# Patient Record
Sex: Male | Born: 1965 | Race: White | Hispanic: No | Smoking: Former smoker
Health system: Southern US, Community
[De-identification: ages and names within clinical notes are randomized; demographics above are authoritative.]

## PROBLEM LIST (undated history)

## (undated) DIAGNOSIS — I10 Essential (primary) hypertension: Secondary | ICD-10-CM

## (undated) HISTORY — PX: ROTATOR CUFF REPAIR: SHX139

## (undated) HISTORY — PX: HERNIA REPAIR: SHX51

## (undated) HISTORY — PX: NOSE SURGERY: SHX723

---

## 2015-06-22 ENCOUNTER — Emergency Department (HOSPITAL_COMMUNITY)

## 2015-06-22 ENCOUNTER — Encounter (HOSPITAL_COMMUNITY): Payer: Self-pay | Admitting: Emergency Medicine

## 2015-06-22 ENCOUNTER — Inpatient Hospital Stay (HOSPITAL_COMMUNITY)
Admission: EM | Admit: 2015-06-22 | Discharge: 2015-06-25 | DRG: 194 | Attending: Internal Medicine | Admitting: Internal Medicine

## 2015-06-22 DIAGNOSIS — Z8249 Family history of ischemic heart disease and other diseases of the circulatory system: Secondary | ICD-10-CM

## 2015-06-22 DIAGNOSIS — J441 Chronic obstructive pulmonary disease with (acute) exacerbation: Secondary | ICD-10-CM | POA: Diagnosis present

## 2015-06-22 DIAGNOSIS — Q249 Congenital malformation of heart, unspecified: Secondary | ICD-10-CM | POA: Diagnosis not present

## 2015-06-22 DIAGNOSIS — Z87891 Personal history of nicotine dependence: Secondary | ICD-10-CM | POA: Diagnosis not present

## 2015-06-22 DIAGNOSIS — J09X2 Influenza due to identified novel influenza A virus with other respiratory manifestations: Principal | ICD-10-CM | POA: Diagnosis present

## 2015-06-22 DIAGNOSIS — J45901 Unspecified asthma with (acute) exacerbation: Secondary | ICD-10-CM | POA: Diagnosis present

## 2015-06-22 DIAGNOSIS — R0600 Dyspnea, unspecified: Secondary | ICD-10-CM | POA: Diagnosis not present

## 2015-06-22 DIAGNOSIS — J40 Bronchitis, not specified as acute or chronic: Secondary | ICD-10-CM

## 2015-06-22 DIAGNOSIS — Z720 Tobacco use: Secondary | ICD-10-CM | POA: Diagnosis present

## 2015-06-22 DIAGNOSIS — J111 Influenza due to unidentified influenza virus with other respiratory manifestations: Secondary | ICD-10-CM

## 2015-06-22 DIAGNOSIS — J101 Influenza due to other identified influenza virus with other respiratory manifestations: Secondary | ICD-10-CM | POA: Diagnosis not present

## 2015-06-22 DIAGNOSIS — R0602 Shortness of breath: Secondary | ICD-10-CM | POA: Diagnosis present

## 2015-06-22 DIAGNOSIS — I10 Essential (primary) hypertension: Secondary | ICD-10-CM | POA: Diagnosis present

## 2015-06-22 DIAGNOSIS — I318 Other specified diseases of pericardium: Secondary | ICD-10-CM | POA: Diagnosis present

## 2015-06-22 DIAGNOSIS — Q248 Other specified congenital malformations of heart: Secondary | ICD-10-CM

## 2015-06-22 HISTORY — DX: Essential (primary) hypertension: I10

## 2015-06-22 LAB — BASIC METABOLIC PANEL
Anion gap: 4 — ABNORMAL LOW (ref 5–15)
BUN: 10 mg/dL (ref 6–20)
CHLORIDE: 107 mmol/L (ref 101–111)
CO2: 28 mmol/L (ref 22–32)
CREATININE: 0.91 mg/dL (ref 0.61–1.24)
Calcium: 8.8 mg/dL — ABNORMAL LOW (ref 8.9–10.3)
GFR calc Af Amer: >60 mL/min (ref >60)
GFR calc non Af Amer: >60 mL/min (ref >60)
Glucose, Bld: 87 mg/dL (ref 65–99)
Potassium: 4.1 mmol/L (ref 3.5–5.1)
Sodium: 139 mmol/L (ref 135–145)

## 2015-06-22 LAB — CBC WITH DIFFERENTIAL/PLATELET
Basophils Absolute: 0 10*3/uL (ref 0.0–0.1)
Basophils Relative: 1 %
EOS ABS: 0.4 10*3/uL (ref 0.0–0.7)
Eosinophils Relative: 7 %
HEMATOCRIT: 42.3 % (ref 39.0–52.0)
HEMOGLOBIN: 14 g/dL (ref 13.0–17.0)
LYMPHS ABS: 2.2 10*3/uL (ref 0.7–4.0)
Lymphocytes Relative: 40 %
MCH: 31.3 pg (ref 26.0–34.0)
MCHC: 33.1 g/dL (ref 30.0–36.0)
MCV: 94.6 fL (ref 78.0–100.0)
MONOS PCT: 11 %
Monocytes Absolute: 0.6 10*3/uL (ref 0.1–1.0)
NEUTROS ABS: 2.3 10*3/uL (ref 1.7–7.7)
NEUTROS PCT: 41 %
Platelets: 214 10*3/uL (ref 150–400)
RBC: 4.47 MIL/uL (ref 4.22–5.81)
RDW: 13.6 % (ref 11.5–15.5)
WBC: 5.5 10*3/uL (ref 4.0–10.5)

## 2015-06-22 LAB — INFLUENZA PANEL BY PCR (TYPE A & B)
H1N1FLUPCR: NOT DETECTED
INFLAPCR: POSITIVE — AB
Influenza B By PCR: NEGATIVE

## 2015-06-22 MED ORDER — IPRATROPIUM BROMIDE 0.02 % IN SOLN
0.5000 mg | Freq: Once | RESPIRATORY_TRACT | Status: AC
  Administered 2015-06-22: 0.5 mg via RESPIRATORY_TRACT
  Filled 2015-06-22: qty 2.5

## 2015-06-22 MED ORDER — OSELTAMIVIR PHOSPHATE 75 MG PO CAPS
75.0000 mg | ORAL_CAPSULE | Freq: Once | ORAL | Status: AC
  Administered 2015-06-22: 75 mg via ORAL
  Filled 2015-06-22: qty 1

## 2015-06-22 MED ORDER — IBUPROFEN 800 MG PO TABS
800.0000 mg | ORAL_TABLET | Freq: Once | ORAL | Status: AC
  Administered 2015-06-22: 800 mg via ORAL
  Filled 2015-06-22: qty 1

## 2015-06-22 MED ORDER — ACETAMINOPHEN 500 MG PO TABS
1000.0000 mg | ORAL_TABLET | Freq: Once | ORAL | Status: AC
  Administered 2015-06-22: 1000 mg via ORAL
  Filled 2015-06-22: qty 2

## 2015-06-22 MED ORDER — METHYLPREDNISOLONE SODIUM SUCC 125 MG IJ SOLR
125.0000 mg | Freq: Once | INTRAMUSCULAR | Status: AC
  Administered 2015-06-22: 125 mg via INTRAVENOUS
  Filled 2015-06-22: qty 2

## 2015-06-22 MED ORDER — SODIUM CHLORIDE 0.9 % IV BOLUS (SEPSIS)
1000.0000 mL | Freq: Once | INTRAVENOUS | Status: AC
  Administered 2015-06-22: 1000 mL via INTRAVENOUS

## 2015-06-22 MED ORDER — IPRATROPIUM-ALBUTEROL 0.5-2.5 (3) MG/3ML IN SOLN
3.0000 mL | Freq: Once | RESPIRATORY_TRACT | Status: AC
  Administered 2015-06-22: 3 mL via RESPIRATORY_TRACT
  Filled 2015-06-22: qty 3

## 2015-06-22 MED ORDER — MAGNESIUM SULFATE 2 GM/50ML IV SOLN
2.0000 g | Freq: Once | INTRAVENOUS | Status: AC
  Administered 2015-06-22: 2 g via INTRAVENOUS
  Filled 2015-06-22: qty 50

## 2015-06-22 MED ORDER — IOHEXOL 300 MG/ML  SOLN
75.0000 mL | Freq: Once | INTRAMUSCULAR | Status: AC | PRN
  Administered 2015-06-22: 75 mL via INTRAVENOUS

## 2015-06-22 MED ORDER — ALBUTEROL (5 MG/ML) CONTINUOUS INHALATION SOLN
10.0000 mg/h | INHALATION_SOLUTION | Freq: Once | RESPIRATORY_TRACT | Status: AC
  Administered 2015-06-22: 10 mg/h via RESPIRATORY_TRACT
  Filled 2015-06-22: qty 20

## 2015-06-22 NOTE — ED Notes (Signed)
Patient given meal tray at this time. Patient has Public relations account executiveCorrectional Officer at bedside at this time.

## 2015-06-22 NOTE — ED Notes (Signed)
Pt reports persistent SOB, cough, and chills since Thursday. Audible wheezing heard throughout lungs. Pt denies hx of COPD/asthma.

## 2015-06-22 NOTE — H&P (Signed)
PCP:   None   Chief Complaint:  Shortness of breath  HPI: This is a 50 y/o incarcerated male who did not receive a flu shot. On Thursday he started becoming SOB, he developed marked myalgia. He had episodes of diaphoresis. He denies fevers. He started wheezing which became progressively worse. He developed a dry hacking cough. She states the last two nights his SOB have been worse and he felt smothered. He finally saw the nurse who sent him to the ER. In the ER he was never hypoxic but he was wheezing greatly. He was given two rounds of nebulizer treatment, the 2nd was a 1 hour continuous neb treatment and continued to wheeze. The hospitalist was asked to admit. History provided by the patient and ER physician.    Review of Systems:  The patient denies anorexia, fever, weight loss,, vision loss, decreased hearing, hoarseness, chest pain, syncope, dyspnea on exertion, wheeze, peripheral edema, balance deficits, hemoptysis, abdominal pain, melena, hematochezia, severe indigestion/heartburn, hematuria, incontinence, genital sores, muscle weakness, muscle ache, suspicious skin lesions, transient blindness, difficulty walking, depression, unusual weight change, abnormal bleeding, enlarged lymph nodes, angioedema, and breast masses.  Past Medical History: Past Medical History  Diagnosis Date  . Hypertension    Past Surgical History  Procedure Laterality Date  . Hernia repair    . Rotator cuff repair    . Nose surgery      Medications: Prior to Admission medications   Medication Sig Start Date End Date Taking? Authorizing Provider  lisinopril (PRINIVIL,ZESTRIL) 40 MG tablet Take 40 mg by mouth daily.   Yes Historical Provider, MD    Allergies:  No Known Allergies  Social History:  reports that he has quit smoking. He does not have any smokeless tobacco history on file. He reports that he does not drink alcohol or use illicit drugs.  Family History: CAD  Physical Exam: Filed Vitals:    06/22/15 1921 06/22/15 1930 06/22/15 1945 06/22/15 2000  BP:  111/68  119/69  Pulse:  77 85 98  Temp:      TempSrc:      Resp:  19 20 15   Height:      Weight:      SpO2: 95% 100% 100% 100%    General:  Alert and oriented times three, well developed and nourished, no acute distress, clammy, somewhat ill appearing Eyes: PERRLA, pink conjunctiva, no scleral icterus ENT: Moist oral mucosa, neck supple, no thyromegaly Lungs: clear to ascultation, generalized wheeze, no crackles, no use of accessory muscles Cardiovascular: regular rate and rhythm, no regurgitation, no gallops, no murmurs. No carotid bruits, no JVD Abdomen: soft, positive BS, non-tender, non-distended, no organomegaly, not an acute abdomen GU: not examined Neuro: CN II - XII grossly intact, sensation intact Musculoskeletal: strength 5/5 all extremities, no clubbing, cyanosis or edema Skin: no rash, no subcutaneous crepitation, no decubitus Psych: appropriate patient   Labs on Admission:   Recent Labs  06/22/15 1626  NA 139  K 4.1  CL 107  CO2 28  GLUCOSE 87  BUN 10  CREATININE 0.91  CALCIUM 8.8*   No results for input(s): AST, ALT, ALKPHOS, BILITOT, PROT, ALBUMIN in the last 72 hours. No results for input(s): LIPASE, AMYLASE in the last 72 hours.  Recent Labs  06/22/15 1626  WBC 5.5  NEUTROABS 2.3  HGB 14.0  HCT 42.3  MCV 94.6  PLT 214   No results for input(s): CKTOTAL, CKMB, CKMBINDEX, TROPONINI in the last 72 hours. Invalid input(s): POCBNP  No results for input(s): DDIMER in the last 72 hours. No results for input(s): HGBA1C in the last 72 hours. No results for input(s): CHOL, HDL, LDLCALC, TRIG, CHOLHDL, LDLDIRECT in the last 72 hours. No results for input(s): TSH, T4TOTAL, T3FREE, THYROIDAB in the last 72 hours.  Invalid input(s): FREET3 No results for input(s): VITAMINB12, FOLATE, FERRITIN, TIBC, IRON, RETICCTPCT in the last 72 hours.  Micro Results: No results found for this or any  previous visit (from the past 240 hour(s)).   Radiological Exams on Admission: Dg Chest 2 View  06/22/2015  CLINICAL DATA:  Persistent SOB, cough, and chills since Thursday. wheezing heard throughout lungs. EXAM: CHEST  2 VIEW COMPARISON:  None available FINDINGS: The heart size is normal. At the right cardiophrenic margin there is an oval mass, measuring approximately 8.7 x 5.4 x 5.8 cm. Lungs are otherwise free of focal consolidations and pleural effusions. No pulmonary edema. Mild mid thoracic spondylosis noted. IMPRESSION: 1. Right cardiophrenic angle mass warrants further evaluation. CT of the chest is recommended. Intravenous contrast is recommended unless it is contraindicated. 2. No focal consolidations or significant bronchitic changes. Electronically Signed   By: Norva Pavlov M.D.   On: 06/22/2015 15:27   Ct Chest W Contrast  06/22/2015  CLINICAL DATA:  Right cardiophrenic angle mass on chest radiographs. Shortness of breath, cough, and chills beginning 4 days ago. Wheezing. EXAM: CT CHEST WITH CONTRAST TECHNIQUE: Multidetector CT imaging of the chest was performed during intravenous contrast administration. CONTRAST:  75mL OMNIPAQUE IOHEXOL 300 MG/ML  SOLN COMPARISON:  Chest radiographs earlier today FINDINGS: Borderline enlarged mediastinal lymph nodes measure up to 1 cm in short axis, most notable in the AP window, right paratracheal, and subcarinal stations. No enlarged hilar lymph nodes are identified. The heart is normal in size. Mild coronary artery calcification is noted. There is a homogeneous low-density cystic mass in the right cardiophrenic angle corresponding to the abnormality on chest radiographs and measuring 6.2 x 4.8 cm. There is no pleural effusion. Major airways are patent. Subsegmental atelectasis is present in the lung bases. The visualized portion of the upper abdomen is unremarkable. Mild thoracic spondylosis is noted. IMPRESSION: 1. 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 2. Mild bibasilar atelectasis. Electronically Signed   By: Sebastian Ache M.D.   On: 06/22/2015 18:41    Assessment/Plan Present on Admission:  . Influenza A -admit to Medsurg -Tamiflu  PO BiD -duonebs as needed -oxygen to keep sats greater than 88% Acute COPD Exacerbation -solumedrol  IV every 8 hours HTN -stable, resume home medications  Right cardiophrenic angle mass -Dr Manus Gunning spoke with CT surgeon who staes nothing to do now but patient may need elective surgery in future   Serria Sloma 06/22/2015, 8:45 PM

## 2015-06-22 NOTE — ED Provider Notes (Signed)
CSN: [id number]     Arrival date & time 06/22/15  1453 History   First MD Initiated Contact with Patient 06/22/15 1613     Chief Complaint  Patient presents with  . Shortness of Breath     (Consider location/radiation/quality/duration/timing/severity/associated sxs/prior Treatment) HPI Comments: Patient from prison with 4 day history of URI symptoms, cough, bodyaches, chills, SOB, congestion.  No documented fevers or chest pain.  Former smoker.  Denies any diagnosis of COPD or asthma. No leg pain or leg swelling.  Has had poor appetite but no vomiting or diarrhea.  Sick contacts at prison.  Did not receive flu shot.  Has been incarcerated 9 months.  But was also in prison in the 1990s.  The history is provided by the patient.    Past Medical History  Diagnosis Date  . Hypertension    Past Surgical History  Procedure Laterality Date  . Hernia repair    . Rotator cuff repair    . Nose surgery     No family history on file. Social History  Substance Use Topics  . Smoking status: Former Games developer  . Smokeless tobacco: None  . Alcohol Use: No    Review of Systems  Constitutional: Positive for chills, activity change, appetite change and fatigue. Negative for fever.  HENT: Positive for congestion and rhinorrhea.   Eyes: Negative for visual disturbance.  Respiratory: Positive for cough, chest tightness and shortness of breath.   Cardiovascular: Negative for chest pain and leg swelling.  Gastrointestinal: Negative for nausea and vomiting.  Genitourinary: Negative for dysuria and hematuria.  Musculoskeletal: Positive for myalgias and arthralgias.  Skin: Negative for rash.  Neurological: Positive for weakness. Negative for dizziness and headaches.  A complete 10 system review of systems was obtained and all systems are negative except as noted in the HPI and PMH.      Allergies  Review of patient's allergies indicates no known allergies.  Home Medications   Prior to Admission  medications   Medication Sig Start Date End Date Taking? Authorizing Provider  lisinopril (PRINIVIL,ZESTRIL) 40 MG tablet Take 40 mg by mouth daily.   Yes Historical Provider, MD   BP 106/54 mmHg  Pulse 95  Temp(Src) 98.6 F (37 C) (Oral)  Resp 21  Ht 6' (1.829 m)  Wt 200 lb (90.719 kg)  BMI 27.12 kg/m2  SpO2 92% Physical Exam  Constitutional: He is oriented to person, place, and time. He appears well-developed and well-nourished. No distress.  Shaking chills  HENT:  Head: Normocephalic and atraumatic.  Mouth/Throat: Oropharynx is clear and moist. No oropharyngeal exudate.  Eyes: Conjunctivae and EOM are normal. Pupils are equal, round, and reactive to light.  Neck: Normal range of motion. Neck supple.  No meningismus.  Cardiovascular: Normal rate, regular rhythm, normal heart sounds and intact distal pulses.   No murmur heard. Pulmonary/Chest: Effort normal. No respiratory distress. He has wheezes.  Wheezing throughout  Abdominal: Soft. There is no tenderness. There is no rebound and no guarding.  Musculoskeletal: Normal range of motion. He exhibits no edema or tenderness.  Neurological: He is alert and oriented to person, place, and time. No cranial nerve deficit. He exhibits normal muscle tone. Coordination normal.  No ataxia on finger to nose bilaterally. No pronator drift. 5/5 strength throughout. CN 2-12 intact.Equal grip strength. Sensation intact.   Skin: Skin is warm.  Psychiatric: He has a normal mood and affect. His behavior is normal.  Nursing note and vitals reviewed.   ED Course  Procedures (including critical care time) Labs Review Labs Reviewed  BASIC METABOLIC PANEL - Abnormal; Notable for the following:    Calcium 8.8 (*)    Anion gap 4 (*)    All other components within normal limits  INFLUENZA PANEL BY PCR (TYPE A & B, H1N1) - Abnormal; Notable for the following:    Influenza A By PCR POSITIVE (*)    All other components within normal limits  CBC WITH  DIFFERENTIAL/PLATELET  CBC  CREATININE, SERUM  BASIC METABOLIC PANEL  CBC    Imaging Review Dg Chest 2 View  06/22/2015  CLINICAL DATA:  Persistent SOB, cough, and chills since Thursday. wheezing heard throughout lungs. EXAM: CHEST  2 VIEW COMPARISON:  None available FINDINGS: The heart size is normal. At the right cardiophrenic margin there is an oval mass, measuring approximately 8.7 x 5.4 x 5.8 cm. Lungs are otherwise free of focal consolidations and pleural effusions. No pulmonary edema. Mild mid thoracic spondylosis noted. IMPRESSION: 1. Right cardiophrenic angle mass warrants further evaluation. CT of the chest is recommended. Intravenous contrast is recommended unless it is contraindicated. 2. No focal consolidations or significant bronchitic changes. Electronically Signed   By: Norva PavlovElizabeth  Brown M.D.   On: 06/22/2015 15:27   Ct Chest W Contrast  06/22/2015  CLINICAL DATA:  Right cardiophrenic angle mass on chest radiographs. Shortness of breath, cough, and chills beginning 4 days ago. Wheezing. EXAM: CT CHEST WITH CONTRAST TECHNIQUE: Multidetector CT imaging of the chest was performed during intravenous contrast administration. CONTRAST:  75mL OMNIPAQUE IOHEXOL 300 MG/ML  SOLN COMPARISON:  Chest radiographs earlier today FINDINGS: Borderline enlarged mediastinal lymph nodes measure up to 1 cm in short axis, most notable in the AP window, right paratracheal, and subcarinal stations. No enlarged hilar lymph nodes are identified. The heart is normal in size. Mild coronary artery calcification is noted. There is a homogeneous low-density cystic mass in the right cardiophrenic angle corresponding to the abnormality on chest radiographs and measuring 6.2 x 4.8 cm. There is no pleural effusion. Major airways are patent. Subsegmental atelectasis is present in the lung bases. The visualized portion of the upper abdomen is unremarkable. Mild thoracic spondylosis is noted. IMPRESSION: 1. 6.2 cm right  cardiophrenic angle mass consistent with a pericardial cyst. 2. Mild bibasilar atelectasis. Electronically Signed   By: Sebastian AcheAllen  Grady M.D.   On: 06/22/2015 18:41   I have personally reviewed and evaluated these images and lab results as part of my medical decision-making.   EKG Interpretation   Date/Time:  Monday June 22 2015 16:19:30 EDT Ventricular Rate:  66 PR Interval:  176 QRS Duration: 94 QT Interval:  392 QTC Calculation: 411 R Axis:   72 Text Interpretation:  Sinus rhythm No previous ECGs available Confirmed by  Samah Lapiana  MD, Sherell Christoffel [phone]([id number]) on 06/22/2015 4:26:41 PM      MDM   Final diagnoses:  Bronchitis  Influenza   4 days of URI symptoms with cough, wheezing, bodyaches.  Former smoker.  Patient given nebulizers and steroids. There is an irregular mass on his chest x-ray.  Remains wheezing after 2 nebulizers and steroids. He is not hypoxic with ambulation.  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 Discussed with Dr. Loistine ChanceGehrhardt. He recommends outpatient followup back at prison. Patient still wheezing after multiple nebs.  He is able to ambulate, does not become hypoxic but is very dyspneic.   Flu swab positive.  Will start tamiflu. With persistent increased work of breathing and wheezing will  admit.  D/w Dr. Joneen Roach.   Glynn Octave, MD 06/23/15 [phone]

## 2015-06-23 ENCOUNTER — Inpatient Hospital Stay (HOSPITAL_COMMUNITY)

## 2015-06-23 DIAGNOSIS — R0600 Dyspnea, unspecified: Secondary | ICD-10-CM

## 2015-06-23 LAB — BASIC METABOLIC PANEL
Anion gap: 8 (ref 5–15)
BUN: 12 mg/dL (ref 6–20)
CO2: 20 mmol/L — AB (ref 22–32)
Calcium: 8.7 mg/dL — ABNORMAL LOW (ref 8.9–10.3)
Chloride: 110 mmol/L (ref 101–111)
Creatinine, Ser: 0.89 mg/dL (ref 0.61–1.24)
GFR calc non Af Amer: >60 mL/min (ref >60)
Glucose, Bld: 203 mg/dL — ABNORMAL HIGH (ref 65–99)
Potassium: 4.2 mmol/L (ref 3.5–5.1)
SODIUM: 138 mmol/L (ref 135–145)

## 2015-06-23 LAB — ECHOCARDIOGRAM COMPLETE
HEIGHTINCHES: 72 in
Weight: 3200 oz

## 2015-06-23 LAB — CBC
HEMATOCRIT: 36.2 % — AB (ref 39.0–52.0)
Hemoglobin: 11.9 g/dL — ABNORMAL LOW (ref 13.0–17.0)
MCH: 31.5 pg (ref 26.0–34.0)
MCHC: 32.9 g/dL (ref 30.0–36.0)
MCV: 95.8 fL (ref 78.0–100.0)
Platelets: 197 10*3/uL (ref 150–400)
RBC: 3.78 MIL/uL — ABNORMAL LOW (ref 4.22–5.81)
RDW: 13.5 % (ref 11.5–15.5)
WBC: 4 10*3/uL (ref 4.0–10.5)

## 2015-06-23 LAB — MRSA PCR SCREENING: MRSA by PCR: NEGATIVE

## 2015-06-23 MED ORDER — IPRATROPIUM-ALBUTEROL 0.5-2.5 (3) MG/3ML IN SOLN
3.0000 mL | RESPIRATORY_TRACT | Status: DC
  Administered 2015-06-23 – 2015-06-24 (×7): 3 mL via RESPIRATORY_TRACT
  Filled 2015-06-23 (×7): qty 3

## 2015-06-23 MED ORDER — OSELTAMIVIR PHOSPHATE 75 MG PO CAPS
75.0000 mg | ORAL_CAPSULE | Freq: Two times a day (BID) | ORAL | Status: DC
  Administered 2015-06-23 – 2015-06-25 (×6): 75 mg via ORAL
  Filled 2015-06-23 (×6): qty 1

## 2015-06-23 MED ORDER — IPRATROPIUM-ALBUTEROL 0.5-2.5 (3) MG/3ML IN SOLN
3.0000 mL | RESPIRATORY_TRACT | Status: DC | PRN
  Administered 2015-06-23 (×2): 3 mL via RESPIRATORY_TRACT
  Filled 2015-06-23 (×2): qty 3

## 2015-06-23 MED ORDER — ENOXAPARIN SODIUM 40 MG/0.4ML ~~LOC~~ SOLN
40.0000 mg | SUBCUTANEOUS | Status: DC
  Administered 2015-06-23 – 2015-06-24 (×3): 40 mg via SUBCUTANEOUS
  Filled 2015-06-23 (×3): qty 0.4

## 2015-06-23 MED ORDER — ACETAMINOPHEN 650 MG RE SUPP: 650.0000 mg | Freq: Four times a day (QID) | RECTAL | Status: DC | PRN

## 2015-06-23 MED ORDER — ACETAMINOPHEN 325 MG PO TABS
650.0000 mg | ORAL_TABLET | Freq: Four times a day (QID) | ORAL | Status: DC | PRN
  Administered 2015-06-23 – 2015-06-24 (×2): 650 mg via ORAL
  Filled 2015-06-23 (×2): qty 2

## 2015-06-23 MED ORDER — NICOTINE 14 MG/24HR TD PT24: 14.0000 mg | MEDICATED_PATCH | Freq: Every day | TRANSDERMAL | Status: DC

## 2015-06-23 MED ORDER — OXYCODONE-ACETAMINOPHEN 5-325 MG PO TABS
1.0000 | ORAL_TABLET | ORAL | Status: DC | PRN
  Administered 2015-06-23 (×2): 2 via ORAL
  Administered 2015-06-24 – 2015-06-25 (×4): 1 via ORAL
  Filled 2015-06-23 (×4): qty 1
  Filled 2015-06-23 (×2): qty 2

## 2015-06-23 MED ORDER — GUAIFENESIN-CODEINE 100-10 MG/5ML PO SOLN
10.0000 mL | ORAL | Status: DC | PRN
  Administered 2015-06-23 – 2015-06-24 (×4): 10 mL via ORAL
  Filled 2015-06-23 (×4): qty 10

## 2015-06-23 MED ORDER — SENNOSIDES-DOCUSATE SODIUM 8.6-50 MG PO TABS
1.0000 | ORAL_TABLET | Freq: Every evening | ORAL | Status: DC | PRN
  Filled 2015-06-23: qty 1

## 2015-06-23 MED ORDER — PROMETHAZINE HCL 12.5 MG PO TABS: 12.5000 mg | ORAL_TABLET | Freq: Four times a day (QID) | ORAL | Status: DC | PRN

## 2015-06-23 MED ORDER — LISINOPRIL 10 MG PO TABS
40.0000 mg | ORAL_TABLET | Freq: Every day | ORAL | Status: DC
  Administered 2015-06-23 – 2015-06-25 (×3): 40 mg via ORAL
  Filled 2015-06-23 (×3): qty 4

## 2015-06-23 MED ORDER — METHYLPREDNISOLONE SODIUM SUCC 125 MG IJ SOLR
60.0000 mg | Freq: Three times a day (TID) | INTRAMUSCULAR | Status: DC
  Administered 2015-06-23 – 2015-06-25 (×9): 60 mg via INTRAVENOUS
  Filled 2015-06-23 (×9): qty 2

## 2015-06-23 NOTE — Progress Notes (Signed)
[facility] PROGRESS NOTE  Ronald Willis XLK:[id number] DOB: 12/21/1965    PCP:   No primary care provider on file.   HPI: Ronald Willis is an 50 y.o. male with hx of HTN, ? Known pericardial cyst, asthma, admitted from prison for influenza, bronchospasm, and enlarging pericardial cyst.  He is doing a little better.  He is having myalgia, and wheezing.  Thoracic surgery was consulted over the phone, recommneded follow up after discharge.   Rewiew of Systems:  Constitutional: Negative for malaise, fever and chills. No significant weight loss or weight gain Eyes: Negative for eye pain, redness and discharge, diplopia, visual changes, or flashes of light. ENMT: Negative for ear pain, hoarseness, nasal congestion, sinus pressure and sore throat. No headaches; tinnitus, drooling, or problem swallowing. Cardiovascular: Negative for chest pain, palpitations, diaphoresis, dyspnea and peripheral edema. ; No orthopnea, PND Respiratory: Negative for cough, hemoptysis, wheezing and stridor. No pleuritic chestpain. Gastrointestinal: Negative for nausea, vomiting, diarrhea, constipation, abdominal pain, melena, blood in stool, hematemesis, jaundice and rectal bleeding.    Genitourinary: Negative for frequency, dysuria, incontinence,flank pain and hematuria; Musculoskeletal: Negative for back pain and neck pain. Negative for swelling and trauma.;  Skin: . Negative for pruritus, rash, abrasions, bruising and skin lesion.; ulcerations Neuro: Negative for headache, lightheadedness and neck stiffness. Negative for weakness, altered level of consciousness , altered mental status, extremity weakness, burning feet, involuntary movement, seizure and syncope.  Psych: negative for anxiety, depression, insomnia, tearfulness, panic attacks, hallucinations, paranoia, suicidal or homicidal ideation  Past Medical History  Diagnosis Date  . Hypertension     Past Surgical History  Procedure Laterality Date   . Hernia repair    . Rotator cuff repair    . Nose surgery      Medications:  HOME MEDS: Prior to Admission medications   Medication Sig Start Date End Date Taking? Authorizing Provider  lisinopril (PRINIVIL,ZESTRIL) 40 MG tablet Take 40 mg by mouth daily.   Yes Historical Provider, MD     Allergies:  No Known Allergies  Social History:   reports that he has quit smoking. He does not have any smokeless tobacco history on file. He reports that he does not drink alcohol or use illicit drugs.  Family History: No family history on file.   Physical Exam: Filed Vitals:   06/23/15 0630 06/23/15 0700 06/23/15 0730 06/23/15 1035  BP: 115/59 120/72 117/70   Pulse: 56 76 47   Temp:      TempSrc:      Resp: 15 22 12    Height:      Weight:      SpO2: 96% 99% 96% 95%   Blood pressure 117/70, pulse 47, temperature 98.6 F (37 C), temperature source Oral, resp. rate 12, height 6' (1.829 m), weight 90.719 kg (200 lb), SpO2 95 %.  GEN:  Pleasant  patient lying in the stretcher in no acute distress; cooperative with exam. PSYCH:  alert and oriented x4; does not appear anxious or depressed; affect is appropriate. HEENT: Mucous membranes pink and anicteric; PERRLA; EOM intact; no cervical lymphadenopathy nor thyromegaly or carotid bruit; no JVD; There were no stridor. Neck is very supple. Breasts:: Not examined CHEST WALL: No tenderness CHEST: Normal respiration, he is still having significant wheezing. No rales.  HEART: Regular rate and rhythm.  There are no murmur, rub, or gallops.   BACK: No kyphosis or scoliosis; no CVA tenderness ABDOMEN: soft and non-tender; no masses, no organomegaly, normal abdominal bowel sounds; no pannus; no  intertriginous candida. There is no rebound and no distention. Rectal Exam: Not done EXTREMITIES: No bone or joint deformity; age-appropriate arthropathy of the hands and knees; no edema; no ulcerations.  There is no calf tenderness. Genitalia: not  examined PULSES: 2+ and symmetric SKIN: Normal hydration no rash or ulceration CNS: Cranial nerves 2-12 grossly intact no focal lateralizing neurologic deficit.  Speech is fluent; uvula elevated with phonation, facial symmetry and tongue midline. DTR are normal bilaterally, cerebella exam is intact, barbinski is negative and strengths are equaled bilaterally.  No sensory loss.   Labs on Admission:  Basic Metabolic Panel:  Recent Labs Lab 06/22/15 1626 06/23/15 0348  NA 139 138  K 4.1 4.2  CL 107 110  CO2 28 20*  GLUCOSE 87 203*  BUN 10 12  CREATININE 0.91 0.89  CALCIUM 8.8* 8.7*   Liver Function Tests: CBC:  Recent Labs Lab 06/22/15 1626 06/23/15 0348  WBC 5.5 4.0  NEUTROABS 2.3  --   HGB 14.0 11.9*  HCT 42.3 36.2*  MCV 94.6 95.8  PLT 214 197    Radiological Exams on Admission: Dg Chest 2 View  06/22/2015  CLINICAL DATA:  Persistent SOB, cough, and chills since Thursday. wheezing heard throughout lungs. EXAM: CHEST  2 VIEW COMPARISON:  None available FINDINGS: The heart size is normal. At the right cardiophrenic margin there is an oval mass, measuring approximately 8.7 x 5.4 x 5.8 cm. Lungs are otherwise free of focal consolidations and pleural effusions. No pulmonary edema. Mild mid thoracic spondylosis noted. IMPRESSION: 1. Right cardiophrenic angle mass warrants further evaluation. CT of the chest is recommended. Intravenous contrast is recommended unless it is contraindicated. 2. No focal consolidations or significant bronchitic changes. Electronically Signed   By: Norva Pavlov M.D.   On: 06/22/2015 15:27   Ct Chest W Contrast  06/22/2015  CLINICAL DATA:  Right cardiophrenic angle mass on chest radiographs. Shortness of breath, cough, and chills beginning 4 days ago. Wheezing. EXAM: CT CHEST WITH CONTRAST TECHNIQUE: Multidetector CT imaging of the chest was performed during intravenous contrast administration. CONTRAST:  75mL OMNIPAQUE IOHEXOL 300 MG/ML  SOLN  COMPARISON:  Chest radiographs earlier today FINDINGS: Borderline enlarged mediastinal lymph nodes measure up to 1 cm in short axis, most notable in the AP window, right paratracheal, and subcarinal stations. No enlarged hilar lymph nodes are identified. The heart is normal in size. Mild coronary artery calcification is noted. There is a homogeneous low-density cystic mass in the right cardiophrenic angle corresponding to the abnormality on chest radiographs and measuring 6.2 x 4.8 cm. There is no pleural effusion. Major airways are patent. Subsegmental atelectasis is present in the lung bases. The visualized portion of the upper abdomen is unremarkable. Mild thoracic spondylosis is noted. IMPRESSION: 1. 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 2. Mild bibasilar atelectasis. Electronically Signed   By: Sebastian Ache M.D.   On: 06/22/2015 18:41    EKG: Independently reviewed.    Assessment/Plan Present on Admission:  . Influenza A Pericardial cyst Bronchospasm.   PLAN:  Will continue supportive care for influenza.  Continue with Tamiflu and droplet precaution.  Neb Tx.  Will add hycodan cough syrup.  Obtain ECHO for percardial cyst.  No evidence of tamponade.   Will give pain meds for myalgia.   Other plans as per orders. Code Status:FULL CODE.    Houston Siren, MD.  FACP [facility] Pager [phone] 7pm to 7am.  06/23/2015, 2:48 PM

## 2015-06-23 NOTE — ED Notes (Signed)
Pt no distress.    

## 2015-06-24 DIAGNOSIS — Z720 Tobacco use: Secondary | ICD-10-CM

## 2015-06-24 NOTE — Progress Notes (Signed)
[facility] PROGRESS NOTE  Stefan Markarian ZOX:[id number] DOB: [birth year]    PCP:   No primary care provider on file.   HPI:   Ronald Willis is an 50 y.o. male with hx of HTN, ? Known pericardial cyst, asthma, [facility] from [facility] for influenza, bronchospasm, and enlarging pericardial cyst. He is doing a little better. He is having myalgia, and wheezing. Thoracic surgery was consulted over the phone, recommneded follow up after discharge. He was given Percocet and cough syrup, and felt a little better.  He is still wheezing.   ECHO was negative.  Didn't show the pericardial cyst.    Rewiew of Systems:  Constitutional: Negative for malaise, fever and chills. No significant weight loss or weight gain Eyes: Negative for eye pain, redness and discharge, diplopia, visual changes, or flashes of light. ENMT: Negative for ear pain, hoarseness, nasal congestion, sinus pressure and sore throat. No headaches; tinnitus, drooling, or problem swallowing. Cardiovascular: Negative for chest pain, palpitations, diaphoresis, dyspnea and peripheral edema. ; No orthopnea, PND Respiratory: Negative for cough, hemoptysis, wheezing and stridor. No pleuritic chestpain. Gastrointestinal: Negative for nausea, vomiting, diarrhea, constipation, abdominal pain, melena, blood in stool, hematemesis, jaundice and rectal bleeding.    Genitourinary: Negative for frequency, dysuria, incontinence,flank pain and hematuria; Musculoskeletal: Negative for back pain and neck pain. Negative for swelling and trauma.;  Skin: . Negative for pruritus, rash, abrasions, bruising and skin lesion.; ulcerations Neuro: Negative for headache, lightheadedness and neck stiffness. Negative for weakness, altered level of consciousness , altered mental status, extremity weakness, burning feet, involuntary movement, seizure and syncope.  Psych: negative for anxiety, depression, insomnia, tearfulness, panic attacks, hallucinations,  paranoia, suicidal or homicidal ideation    Past Medical History  Diagnosis Date  . Hypertension     Past Surgical History  Procedure Laterality Date  . Hernia repair    . Rotator cuff repair    . Nose surgery      Medications:  HOME MEDS: Prior to Admission medications   Medication Sig Start Date End Date Taking? Authorizing Provider  lisinopril (PRINIVIL,ZESTRIL) 40 MG tablet Take 40 mg by mouth daily.   Yes Historical Provider, MD     Allergies:  No Known Allergies  Social History:   reports that he has quit smoking. He does not have any smokeless tobacco history on file. He reports that he does not drink alcohol or use illicit drugs.  Family History: No family history on file.   Physical Exam: Filed Vitals:   06/23/15 2100 06/23/15 2342 06/24/15 0419 06/24/15 0756  BP: 112/57  115/65   Pulse: 64  62   Temp: 98.1 F (36.7 C)  97.9 F (36.6 C)   TempSrc: Oral  Oral   Resp: 16  16   Height:      Weight:      SpO2: 93% 91% 96% 93%   Blood pressure 115/65, pulse 62, temperature 97.9 F (36.6 C), temperature source Oral, resp. rate 16, height 6' (1.829 m), weight 90.719 kg (200 lb), SpO2 93 %.  GEN:  Pleasant  patient lying in the stretcher in no acute distress; cooperative with exam. PSYCH:  alert and oriented x4; does not appear anxious or depressed; affect is appropriate. HEENT: Mucous membranes pink and anicteric; PERRLA; EOM intact; no cervical lymphadenopathy nor thyromegaly or carotid bruit; no JVD; There were no stridor. Neck is very supple. Breasts:: Not examined CHEST WALL: No tenderness CHEST: Normal respiration, clear to auscultation bilaterally.  HEART: Regular rate and rhythm.  There are no murmur, rub, or gallops.   BACK: No kyphosis or scoliosis; no CVA tenderness ABDOMEN: soft and non-tender; no masses, no organomegaly, normal abdominal bowel sounds; no pannus; no intertriginous candida. There is no rebound and no distention. Rectal Exam: Not  done EXTREMITIES: No bone or joint deformity; age-appropriate arthropathy of the hands and knees; no edema; no ulcerations.  There is no calf tenderness. Genitalia: not examined PULSES: 2+ and symmetric SKIN: Normal hydration no rash or ulceration CNS: Cranial nerves 2-12 grossly intact no focal lateralizing neurologic deficit.  Speech is fluent; uvula elevated with phonation, facial symmetry and tongue midline. DTR are normal bilaterally, cerebella exam is intact, barbinski is negative and strengths are equaled bilaterally.  No sensory loss.   Labs on Admission:  Basic Metabolic Panel:  Recent Labs Lab 06/22/15 1626 06/23/15 0348  NA 139 138  K 4.1 4.2  CL 107 110  CO2 28 20*  GLUCOSE 87 203*  BUN 10 12  CREATININE 0.91 0.89  CALCIUM 8.8* 8.7*   Liver Function Tests: CBC:  Recent Labs Lab 06/22/15 1626 06/23/15 0348  WBC 5.5 4.0  NEUTROABS 2.3  --   HGB 14.0 11.9*  HCT 42.3 36.2*  MCV 94.6 95.8  PLT 214 197    Radiological Exams on Admission: Dg Chest 2 View  06/22/2015  CLINICAL DATA:  Persistent SOB, cough, and chills since Thursday. wheezing heard throughout lungs. EXAM: CHEST  2 VIEW COMPARISON:  None available FINDINGS: The heart size is normal. At the right cardiophrenic margin there is an oval mass, measuring approximately 8.7 x 5.4 x 5.8 cm. Lungs are otherwise free of focal consolidations and pleural effusions. No pulmonary edema. Mild mid thoracic spondylosis noted. IMPRESSION: 1. Right cardiophrenic angle mass warrants further evaluation. CT of the chest is recommended. Intravenous contrast is recommended unless it is contraindicated. 2. No focal consolidations or significant bronchitic changes. Electronically Signed   By: Norva PavlovElizabeth  Brown M.D.   On: 06/22/2015 15:27   Ct Chest W Contrast  06/22/2015  CLINICAL DATA:  Right cardiophrenic angle mass on chest radiographs. Shortness of breath, cough, and chills beginning 4 days ago. Wheezing. EXAM: CT CHEST WITH  CONTRAST TECHNIQUE: Multidetector CT imaging of the chest was performed during intravenous contrast administration. CONTRAST:  75mL OMNIPAQUE IOHEXOL 300 MG/ML  SOLN COMPARISON:  Chest radiographs earlier today FINDINGS: Borderline enlarged mediastinal lymph nodes measure up to 1 cm in short axis, most notable in the AP window, right paratracheal, and subcarinal stations. No enlarged hilar lymph nodes are identified. The heart is normal in size. Mild coronary artery calcification is noted. There is a homogeneous low-density cystic mass in the right cardiophrenic angle corresponding to the abnormality on chest radiographs and measuring 6.2 x 4.8 cm. There is no pleural effusion. Major airways are patent. Subsegmental atelectasis is present in the lung bases. The visualized portion of the upper abdomen is unremarkable. Mild thoracic spondylosis is noted. IMPRESSION: 1. 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 2. Mild bibasilar atelectasis. Electronically Signed   By: Sebastian AcheAllen  Grady M.D.   On: 06/22/2015 18:41    EKG: Independently reviewed.    Assessment/Plan  . Influenza A Pericardial cyst.  Asthma exacerbation.   PLAN:  Will continue to Tx the bronchospasm resulted from his Influenza (main reason for hospitalization).  Continue with IV Steroids, nebs, and continue with Tamiflu.  For his pericardial cyst, he can have follow up with thoracic surgeon after discharge.  Thank you  and Good Day.   Other plans as per orders. Code Status: FULL Unk Lightning, MD.  FACP [facility] Pager [phone] 7pm to 7am.  06/24/2015, 10:58 AM

## 2015-06-25 DIAGNOSIS — Q249 Congenital malformation of heart, unspecified: Secondary | ICD-10-CM

## 2015-06-25 DIAGNOSIS — J441 Chronic obstructive pulmonary disease with (acute) exacerbation: Secondary | ICD-10-CM

## 2015-06-25 DIAGNOSIS — J101 Influenza due to other identified influenza virus with other respiratory manifestations: Secondary | ICD-10-CM

## 2015-06-25 DIAGNOSIS — Q248 Other specified congenital malformations of heart: Secondary | ICD-10-CM

## 2015-06-25 DIAGNOSIS — I10 Essential (primary) hypertension: Secondary | ICD-10-CM

## 2015-06-25 MED ORDER — PREDNISONE 10 MG PO TABS: ORAL_TABLET | ORAL | Status: AC

## 2015-06-25 MED ORDER — IPRATROPIUM-ALBUTEROL 0.5-2.5 (3) MG/3ML IN SOLN
3.0000 mL | RESPIRATORY_TRACT | Status: DC
  Administered 2015-06-25 (×3): 3 mL via RESPIRATORY_TRACT
  Filled 2015-06-25 (×3): qty 3

## 2015-06-25 MED ORDER — IPRATROPIUM-ALBUTEROL 0.5-2.5 (3) MG/3ML IN SOLN: 3.0000 mL | RESPIRATORY_TRACT | Status: AC | PRN

## 2015-06-25 MED ORDER — GUAIFENESIN ER 600 MG PO TB12: 600.0000 mg | ORAL_TABLET | Freq: Two times a day (BID) | ORAL | Status: AC

## 2015-06-25 MED ORDER — OSELTAMIVIR PHOSPHATE 75 MG PO CAPS: 75.0000 mg | ORAL_CAPSULE | Freq: Two times a day (BID) | ORAL | Status: AC

## 2015-06-25 NOTE — Discharge Summary (Signed)
Physician Discharge Summary  Ronald Willis ZOX:[id number] DOB: Aug 04, 1965 DOA: 06/22/2015  PCP: No primary care provider on file.  Admit date: 06/22/2015 Discharge date: 06/25/2015  Time spent: 35 minutes  Recommendations for Outpatient Follow-up:  1. Follow up with PCP in 1-2 weeks 2. Patient will need follow up with a cardiothoracic surgeon to evaluate enlarging pericardial cyst. This will need to be arranged by prison facility.    Discharge Diagnoses:  Active Problems:   HTN (hypertension)   Tobacco use   Influenza A   COPD exacerbation (HCC)   Pericardial cyst   Discharge Condition: improved  Diet recommendation: regular  Filed Weights   06/22/15 1504  Weight: 90.719 kg (200 lb)    History of present illness:  55 yom with a hx of tobacco use and HTN, who is incarcerated and did not receive a flu shot, presented with complaints of SOB and marked myalgia that onset 3/16. He had episodes of diaphoresis, but denied any fever. He began to wheeze, and eventually developed a dry, hacking cough. While in the ED, he was wheezing greatly, but was not hypoxic. He was given two rounds of nebulizer treatments, but continued to wheeze. He was later admitted for further treatment.  Hospital Course:  Pt was found to have an asthma/COPD exacerbation. He also tested positive for Influenza A. He was treated with tamiflu, steroids, and nebs. He was also given O2 to maintain sats above 88%. As of 3/23, his wheezing has resolved and he was breathing comfortably on RA. He will be transitioned to a prednisone taper and can continue nebs PRN. He will complete a course of Tamiflu. He would likely benefit from outpatient pulmonary function test once his respiratory status is back to baseline. 1. Pericardial cyst. ECHO performed 3/21. No evidence of tamponade. Case was discussed by ER with Dr. Tyrone Sage. Recommendations were for patient to follow up as an outpatient. Will need to follow up with thoracic  surgeon after discharge.  2. HTN. Stable  Procedures: 3. ECHO 3/21 Study Conclusions  - Left ventricle: The cavity size was normal. Wall thickness was  increased in a pattern of mild LVH. Systolic function was  vigorous. The estimated ejection fraction was in the range of 65%  to 70%. Wall motion was normal; there were no regional wall  motion abnormalities. Left ventricular diastolic function  parameters were normal. - Aortic valve: Valve area (VTI): 3.29 cm^2. Valve area (Vmax):  3.38 cm^2. Valve area (Vmean): 3.25 cm^2. - Left atrium: The atrium was mildly to moderately dilated. - Technically adequate study.  Consultations:  none  Discharge Exam: Filed Vitals:   06/24/15 2100 06/25/15 0500  BP: 132/70 131/64  Pulse: 60 63  Temp: 97.7 F (36.5 C) 97.8 F (36.6 C)  Resp: 16 16     General: NAD, looks comfortable  Cardiovascular: RRR, S1, S2   Respiratory: mostly clear with mild wheeze bilaterally  Abdomen: soft, non tender, no distention , bowel sounds normal  Musculoskeletal: No edema b/l   Discharge Instructions   Discharge Instructions    Diet - low sodium heart healthy    Complete by:  As directed      Increase activity slowly    Complete by:  As directed           Current Discharge Medication List    START taking these medications   Details  guaiFENesin (MUCINEX) 600 MG 12 hr tablet Take 1 tablet (600 mg total) by mouth 2 (two) times daily. Qty:  30 tablet, Refills: 0    ipratropium-albuterol (DUONEB) 0.5-2.5 (3) MG/3ML SOLN Take 3 mLs by nebulization every 4 (four) hours as needed (shortness of breath/wheezing). Qty: 360 mL, Refills: 0    oseltamivir (TAMIFLU) 75 MG capsule Take 1 capsule (75 mg total) by mouth 2 (two) times daily. Qty: 6 capsule, Refills: 0    predniSONE (DELTASONE) 10 MG tablet Take 40mg  po daily for 2 days then 30mg  daily for 2 days then 20mg  daily for 2 days then 10mg  daily for 2 days then stop Qty: 20 tablet,  Refills: 0      CONTINUE these medications which have NOT CHANGED   Details  lisinopril (PRINIVIL,ZESTRIL) 40 MG tablet Take 40 mg by mouth daily.       No Known Allergies Follow-up Information    Follow up with Delight OvensEdward B Gerhardt, MD.   Specialty:  Cardiothoracic Surgery   Contact information:   [address] [place] [address] [phone]        The results of significant diagnostics from this hospitalization (including imaging, microbiology, ancillary and laboratory) are listed below for reference.    Significant Diagnostic Studies: Dg Chest 2 View  06/22/2015  CLINICAL DATA:  Persistent SOB, cough, and chills since Thursday. wheezing heard throughout lungs. EXAM: CHEST  2 VIEW COMPARISON:  None available FINDINGS: The heart size is normal. At the right cardiophrenic margin there is an oval mass, measuring approximately 8.7 x 5.4 x 5.8 cm. Lungs are otherwise free of focal consolidations and pleural effusions. No pulmonary edema. Mild mid thoracic spondylosis noted. IMPRESSION: 1. Right cardiophrenic angle mass warrants further evaluation. CT of the chest is recommended. Intravenous contrast is recommended unless it is contraindicated. 2. No focal consolidations or significant bronchitic changes. Electronically Signed   By: Norva PavlovElizabeth  Brown M.D.   On: 06/22/2015 15:27   Ct Chest W Contrast  06/22/2015  CLINICAL DATA:  Right cardiophrenic angle mass on chest radiographs. Shortness of breath, cough, and chills beginning 4 days ago. Wheezing. EXAM: CT CHEST WITH CONTRAST TECHNIQUE: Multidetector CT imaging of the chest was performed during intravenous contrast administration. CONTRAST:  75mL OMNIPAQUE IOHEXOL 300 MG/ML  SOLN COMPARISON:  Chest radiographs earlier today FINDINGS: Borderline enlarged mediastinal lymph nodes measure up to 1 cm in short axis, most notable in the AP window, right paratracheal, and subcarinal stations. No enlarged hilar lymph nodes are  identified. The heart is normal in size. Mild coronary artery calcification is noted. There is a homogeneous low-density cystic mass in the right cardiophrenic angle corresponding to the abnormality on chest radiographs and measuring 6.2 x 4.8 cm. There is no pleural effusion. Major airways are patent. Subsegmental atelectasis is present in the lung bases. The visualized portion of the upper abdomen is unremarkable. Mild thoracic spondylosis is noted. IMPRESSION: 1. 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 2. Mild bibasilar atelectasis. Electronically Signed   By: Sebastian AcheAllen  Grady M.D.   On: 06/22/2015 18:41    Microbiology: Recent Results (from the past 240 hour(s))  MRSA PCR Screening     Status: None   Collection Time: 06/23/15  9:00 AM  Result Value Ref Range Status   MRSA by PCR NEGATIVE NEGATIVE Final    Comment:        The GeneXpert MRSA Assay (FDA approved for NASAL specimens only), is one component of a comprehensive MRSA colonization surveillance program. It is not intended to diagnose MRSA infection nor to guide or monitor treatment  for MRSA infections.      Labs: Basic Metabolic Panel:  Recent Labs Lab 06/22/15 1626 06/23/15 0348  NA 139 138  K 4.1 4.2  CL 107 110  CO2 28 20*  GLUCOSE 87 203*  BUN 10 12  CREATININE 0.91 0.89  CALCIUM 8.8* 8.7*   Liver Function Tests: No results for input(s): AST, ALT, ALKPHOS, BILITOT, PROT, ALBUMIN in the last 168 hours. No results for input(s): LIPASE, AMYLASE in the last 168 hours. No results for input(s): AMMONIA in the last 168 hours. CBC:  Recent Labs Lab 06/22/15 1626 06/23/15 0348  WBC 5.5 4.0  NEUTROABS 2.3  --   HGB 14.0 11.9*  HCT 42.3 36.2*  MCV 94.6 95.8  PLT 214 197   Cardiac Enzymes: No results for input(s): CKTOTAL, CKMB, CKMBINDEX, TROPONINI in the last 168 hours. BNP: BNP (last 3 results) No results for input(s): BNP in the last 8760 hours.  ProBNP (last 3  results) No results for input(s): PROBNP in the last 8760 hours.  CBG: No results for input(s): GLUCAP in the last 168 hours.     Signed:  Erick Blinks, MD. [facility] 06/25/2015, 1:51 PM  By signing my name below, I, Adron Bene, attest that this documentation has been prepared under the direction and in the presence of Erick Blinks, MD. Electronically Signed: Adron Bene 06/25/2015 12:00pm   I, Dr. Erick Blinks, personally performed the services described in this documentaiton. All medical record entries made by the scribe were at my direction and in my presence. I have reviewed the chart and agree that the record reflects my personal performance and is accurate and complete  Erick Blinks, MD, 06/25/2015 1:51 PM

## 2015-06-25 NOTE — Care Management Note (Signed)
Case Management Note  Patient Details  Name: Ronald Willis MRN: [id number] Date of Birth: 14-Oct-1965  Subjective/Objective:                  [facility], admitted with asthma and flu.   Action/Plan: Pt discharging today. [facility] [place] [facility] [place] [facility] Pt's RN will call and give DR nurse report. No further CM needs identified.   Expected Discharge Date:  06/25/15               Expected Discharge Plan:  [facility]  In-House Referral:  NA  Discharge planning Services  CM Consult  Post Acute Care Choice:  NA Choice offered to:  NA  DME Arranged:    DME Agency:     HH Arranged:    HH Agency:     Status of Service:  Completed, signed off  Medicare Important Message Given:    Date Medicare IM Given:    Medicare IM give by:    Date Additional Medicare IM Given:    Additional Medicare Important Message give by:     If discussed at Long Length of Stay Meetings, dates discussed:    Additional Comments:  Ronald Willis, Ronald Gelardi Demske, RN 06/25/2015, 3:06 PM

## 2015-06-25 NOTE — Progress Notes (Signed)
Patient alert and oriented, independent, VSS, pt. Tolerating diet well. No complaints of pain or nausea. Pt. Had IV removed tip intact. Pt. Had prescriptions given. Pt. Voiced understanding of discharge instructions with no further questions. Pt. Discharged via wheelchair with [facility] guard [facility].  Report given to Sheffield SliderPaul Higgins, RN with [facility].

## 2017-08-16 IMAGING — CT CT CHEST W/ CM
2 of 3 series · 15 of 36 positions shown, 18 images · IV contrast (Omnipaque 300)
Comparison: Chest radiographs earlier today

CLINICAL DATA: Right cardiophrenic angle mass on chest radiographs.
Shortness of breath, cough, and chills beginning 4 days ago.
Wheezing.

EXAM:
CT CHEST WITH CONTRAST
TECHNIQUE: Multidetector CT imaging of the chest was performed during
intravenous contrast administration.
CONTRAST:  75mL OMNIPAQUE IOHEXOL 300 MG/ML  SOLN

[Series 2: chestroutine 5.0 b40f · axial · 0.70mm/px · z∈[-306,-20]mm · 12 of 69 slices shown, 15 images]
[im 6/69  mediastinal]
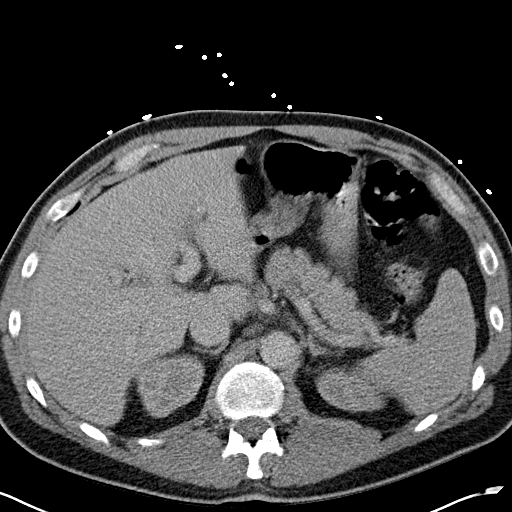
[im 6/69  lung]
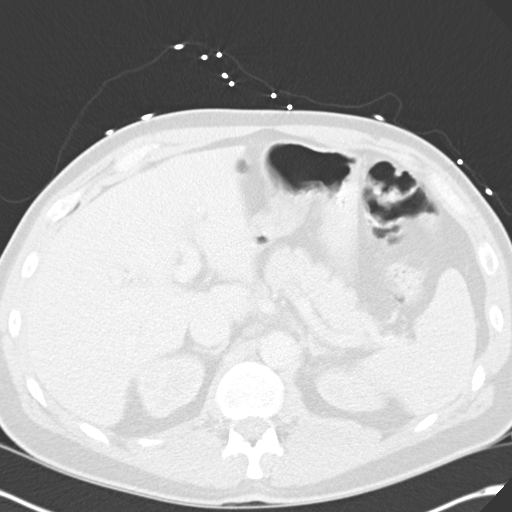
[im 11/69  lung]
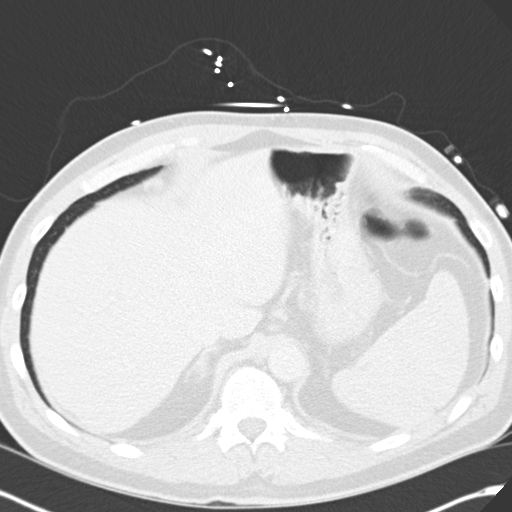
[im 16/69  lung]
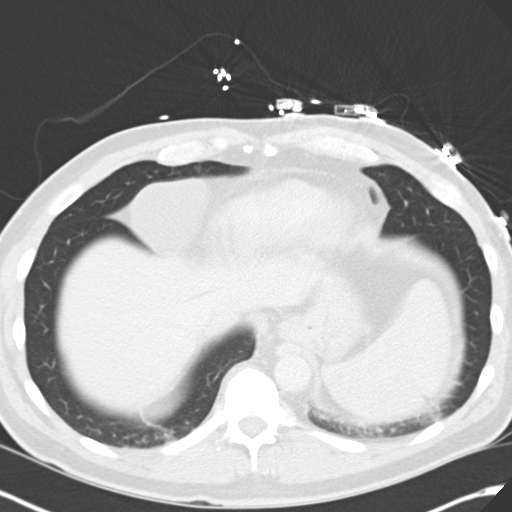
[im 21/69  lung]
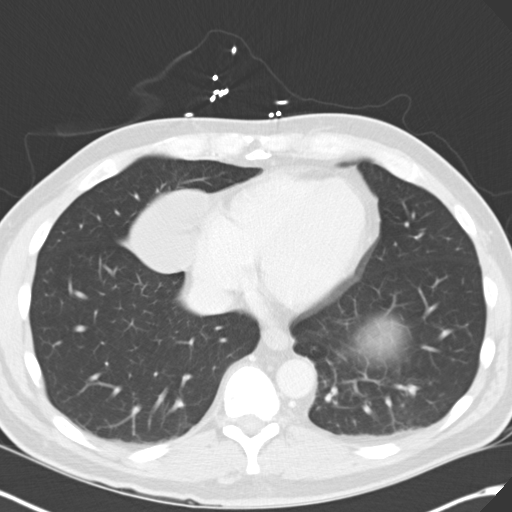
[im 26/69  mediastinal]
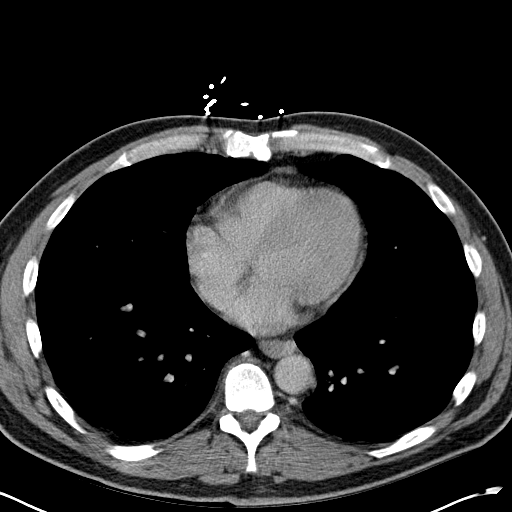
[im 26/69  lung]
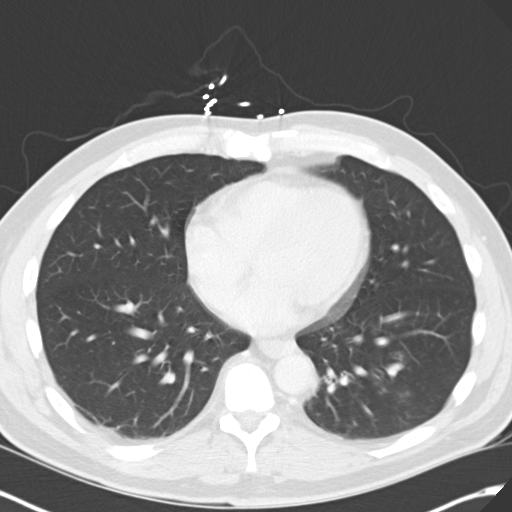
[im 31/69  lung]
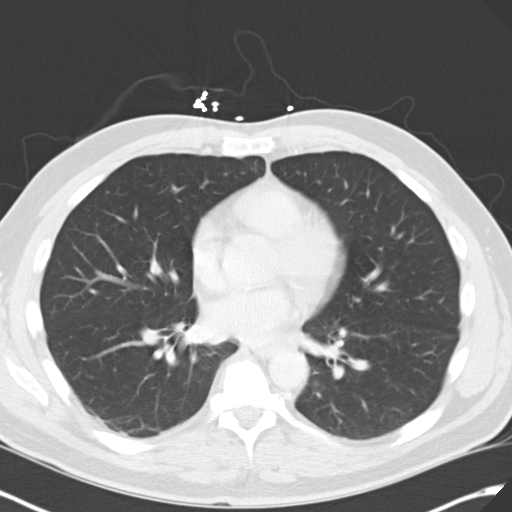
[im 38/69  lung]
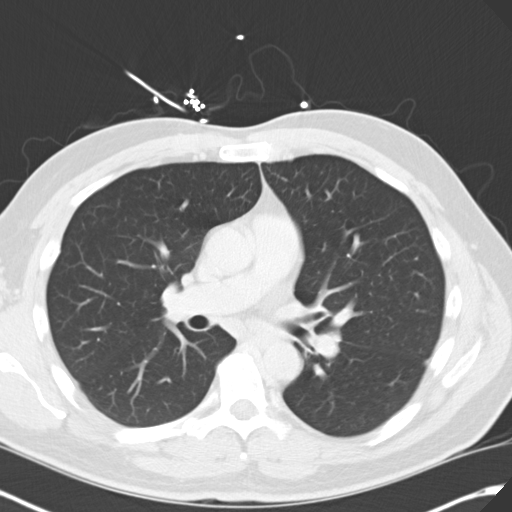
[im 43/69  lung]
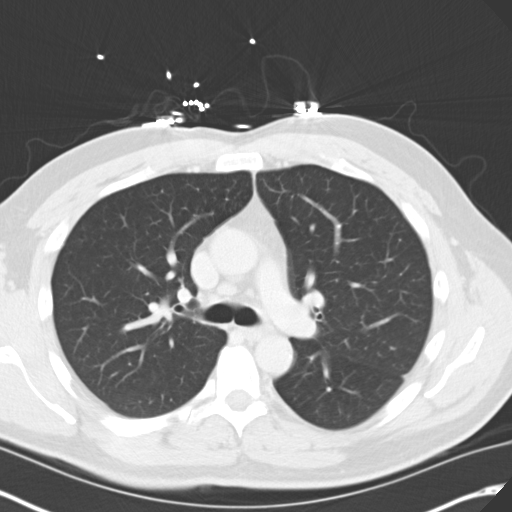
[im 48/69  mediastinal]
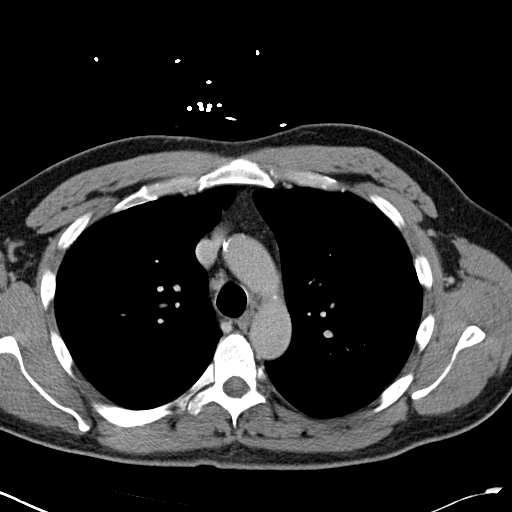
[im 48/69  lung]
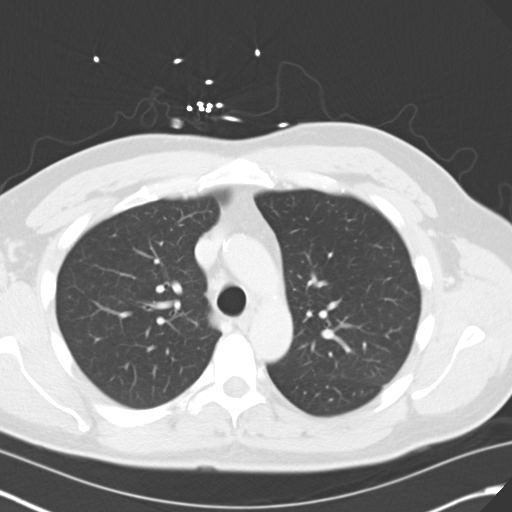
[im 53/69  lung]
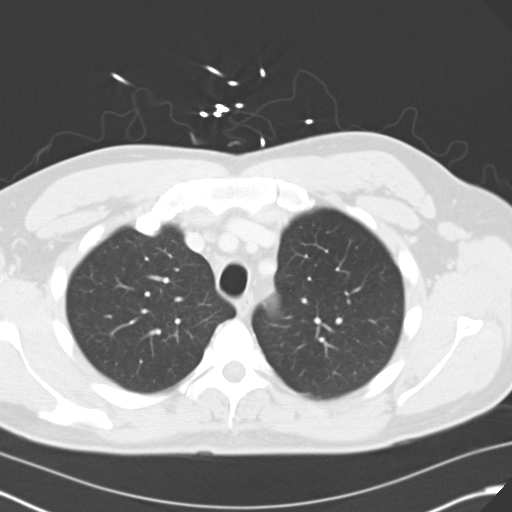
[im 58/69  lung]
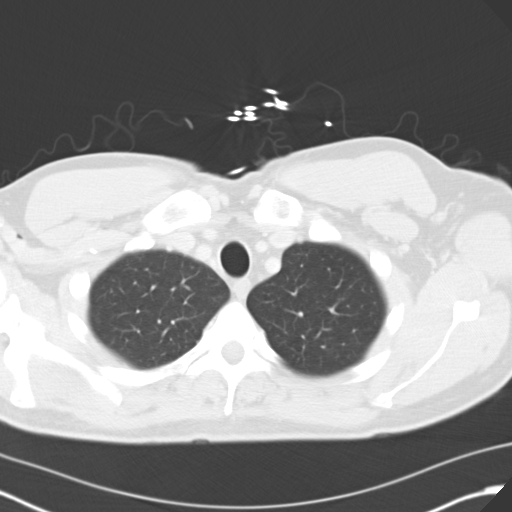
[im 63/69  lung]
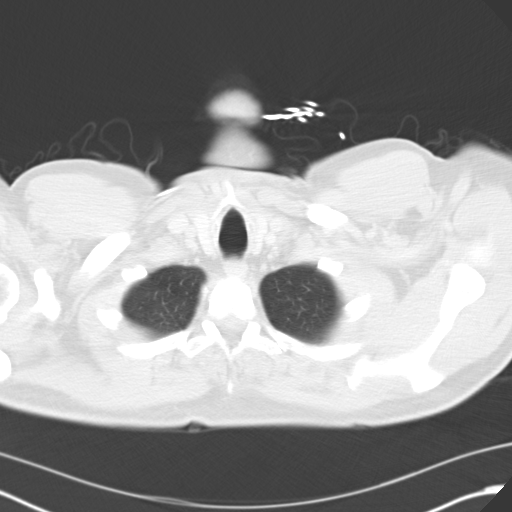

[Series 4: mpr coronal chest 3mm · coronal · 0.70mm/px · 3 of 97 slices shown]
[im 20/97  lung]
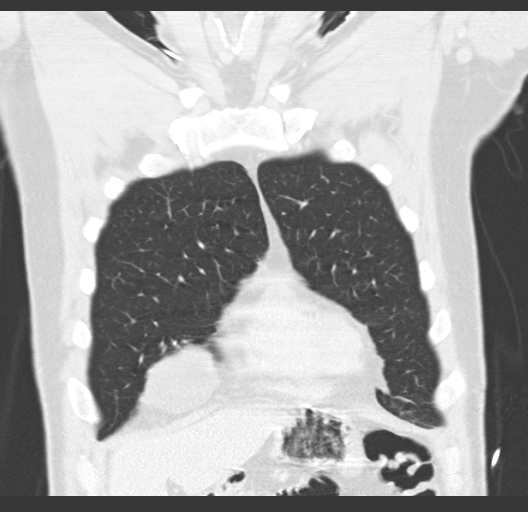
[im 39/97  lung]
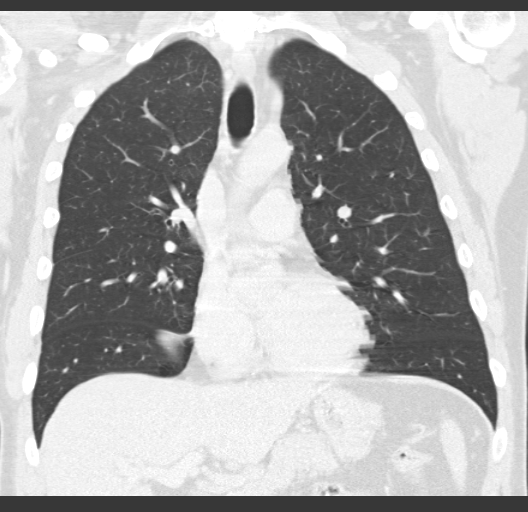
[im 58/97  lung]
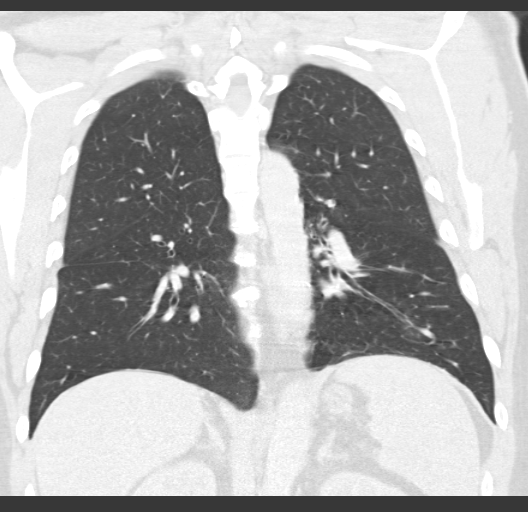

[15 of 36 positions shown; findings below may reference images not displayed]

FINDINGS: Borderline enlarged mediastinal lymph nodes measure up to 1 cm in
short axis, most notable in the AP window, right paratracheal, and
subcarinal stations. No enlarged hilar lymph nodes are identified.
The heart is normal in size. Mild coronary artery calcification is
noted. There is a homogeneous low-density cystic mass in the right
cardiophrenic angle corresponding to the abnormality on chest
radiographs and measuring 6.2 x 4.8 cm. There is no pleural
effusion.

Major airways are patent. Subsegmental atelectasis is present in the
lung bases.

The visualized portion of the upper abdomen is unremarkable. Mild
thoracic spondylosis is noted.
IMPRESSION: 1. 6.2 cm right cardiophrenic angle mass consistent with a
pericardial cyst.
2. Mild bibasilar atelectasis.

## 2023-04-05 DEATH — deceased
